# Patient Record
Sex: Male | Born: 1935 | Race: White | Hispanic: No | Marital: Married | Smoking: Current every day smoker
Health system: Southern US, Community
[De-identification: ages and names within clinical notes are randomized; demographics above are authoritative.]

---

## 2012-10-21 DIAGNOSIS — M159 Polyosteoarthritis, unspecified: Secondary | ICD-10-CM | POA: Insufficient documentation

## 2012-12-09 DIAGNOSIS — I2510 Atherosclerotic heart disease of native coronary artery without angina pectoris: Secondary | ICD-10-CM | POA: Insufficient documentation

## 2013-06-30 DIAGNOSIS — G20 Parkinson's disease: Secondary | ICD-10-CM | POA: Insufficient documentation

## 2013-06-30 DIAGNOSIS — F341 Dysthymic disorder: Secondary | ICD-10-CM | POA: Insufficient documentation

## 2013-06-30 DIAGNOSIS — G20A1 Parkinson's disease without dyskinesia, without mention of fluctuations: Secondary | ICD-10-CM | POA: Insufficient documentation

## 2013-07-22 DIAGNOSIS — M1711 Unilateral primary osteoarthritis, right knee: Secondary | ICD-10-CM | POA: Insufficient documentation

## 2014-05-11 DIAGNOSIS — G4752 REM sleep behavior disorder: Secondary | ICD-10-CM | POA: Insufficient documentation

## 2014-10-06 ENCOUNTER — Ambulatory Visit: Payer: Self-pay | Admitting: Urology

## 2014-10-06 DIAGNOSIS — I10 Essential (primary) hypertension: Secondary | ICD-10-CM

## 2014-10-06 DIAGNOSIS — I2510 Atherosclerotic heart disease of native coronary artery without angina pectoris: Secondary | ICD-10-CM

## 2014-10-07 ENCOUNTER — Ambulatory Visit: Payer: Self-pay | Admitting: Urology

## 2015-12-29 DIAGNOSIS — I5032 Chronic diastolic (congestive) heart failure: Secondary | ICD-10-CM | POA: Insufficient documentation

## 2016-02-10 IMAGING — CR DG ABDOMEN 1V
1 series · 2 of 2 positions shown · non-contrast
Comparison: None.

CLINICAL DATA: Calculus with pain on the right side. Blood in urine

EXAM:
ABDOMEN - 1 VIEW

[Series 1: t abdomen supine · 0.14mm/px · 2 of 2 slices shown]
[im 1/2]
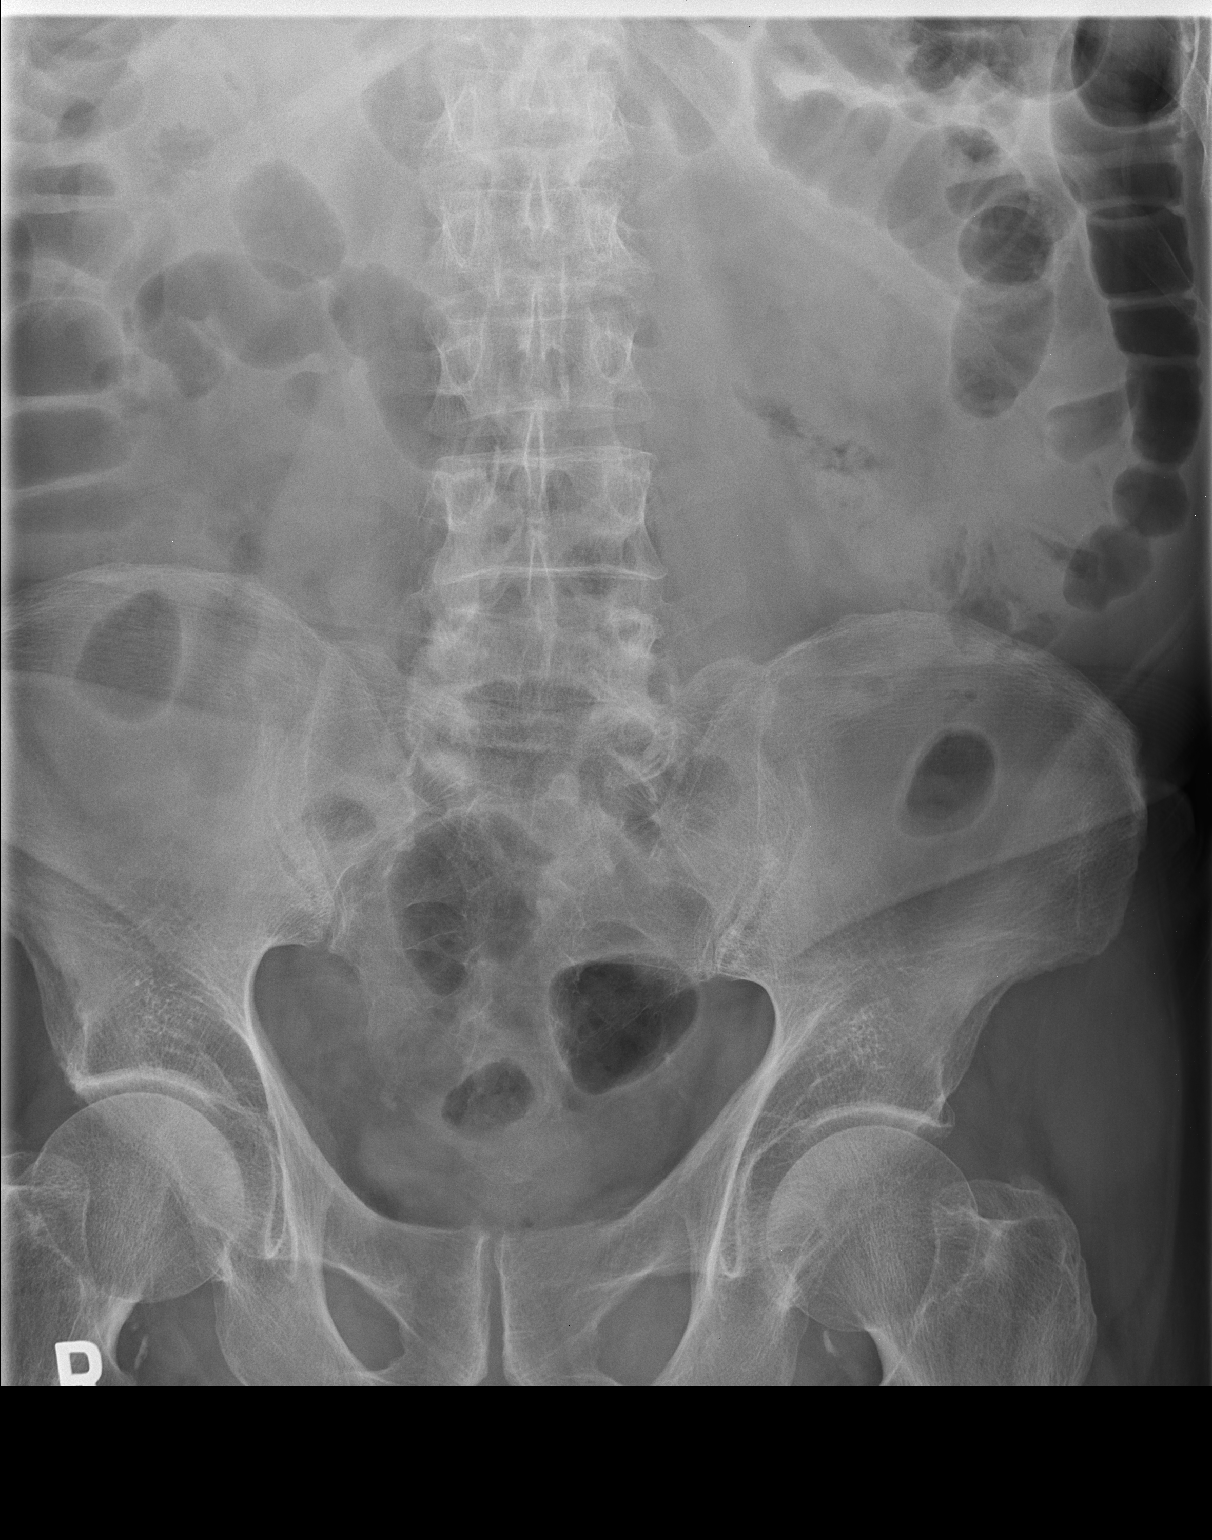
[im 2/2]
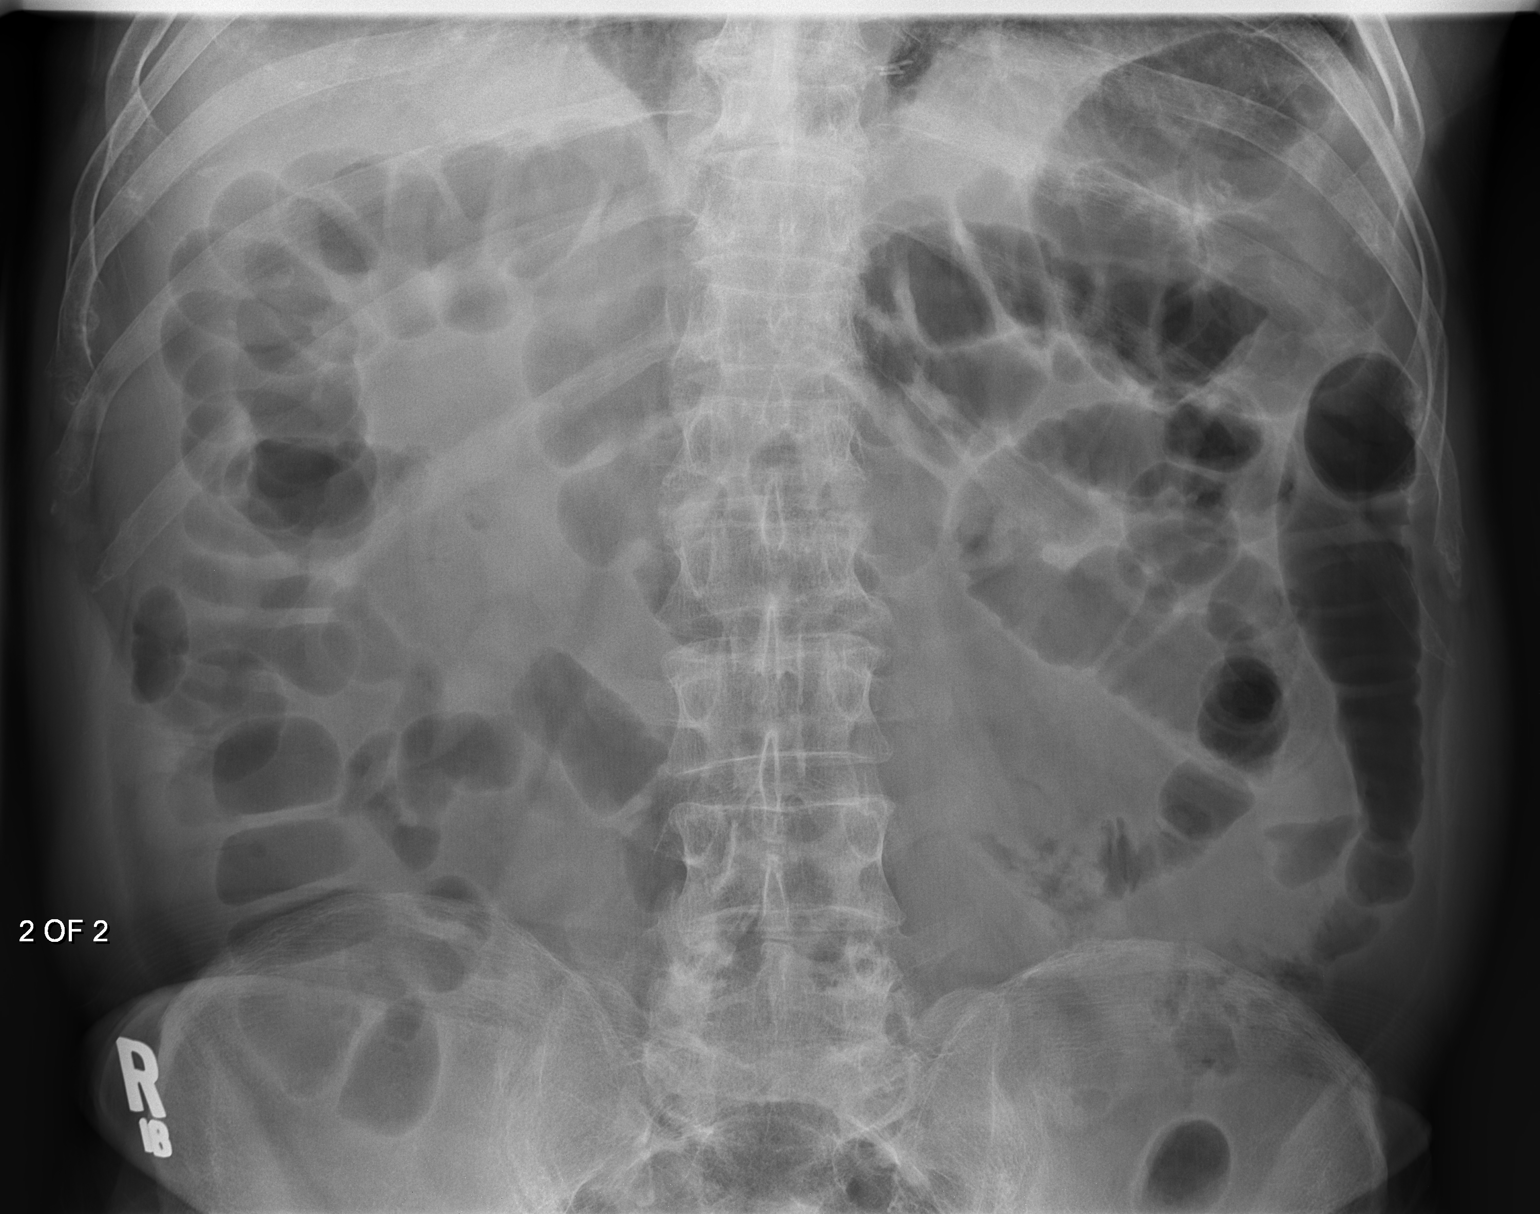

[2 of 2 positions shown; findings below may reference images not displayed]

FINDINGS: There is a 5 mm roughly ovoid calculus in the right hemipelvis.
Shape is not typical for a phlebolith, and given the history this
may represent a stone. No indication of additional urolithiasis, a
left pelvic calcification favors atherosclerosis.

No other explanation for abdominal pain, including bowel
obstruction. No concerning intra-abdominal mass effect.
IMPRESSION: Possible 5 mm distal right ureteral calculus.

## 2020-09-22 DIAGNOSIS — F325 Major depressive disorder, single episode, in full remission: Secondary | ICD-10-CM | POA: Insufficient documentation

## 2021-03-12 DIAGNOSIS — I350 Nonrheumatic aortic (valve) stenosis: Secondary | ICD-10-CM | POA: Insufficient documentation

## 2021-04-10 ENCOUNTER — Encounter: Payer: Self-pay | Admitting: Primary Care

## 2021-04-10 NOTE — Patient Instructions (Signed)
Patient requested ACP document done at same time as his wife's palliative visit. Prepared MOST at his request, left in home and updated VYNCA.

## 2021-04-24 DIAGNOSIS — Z66 Do not resuscitate: Secondary | ICD-10-CM | POA: Insufficient documentation

## 2021-04-24 DIAGNOSIS — R4189 Other symptoms and signs involving cognitive functions and awareness: Secondary | ICD-10-CM | POA: Insufficient documentation

## 2022-03-29 ENCOUNTER — Telehealth: Payer: Self-pay | Admitting: Primary Care

## 2022-03-29 NOTE — Telephone Encounter (Signed)
Attempted to contact patient's nephew Garnette Scheuermann, to offer to schedule a Palliative Consult, no answer - left message with reason for call along with my name and call back number.

## 2022-04-11 ENCOUNTER — Other Ambulatory Visit: Payer: Self-pay | Admitting: Primary Care

## 2022-04-11 ENCOUNTER — Encounter: Payer: Self-pay | Admitting: Primary Care

## 2022-04-11 DIAGNOSIS — G20A1 Parkinson's disease without dyskinesia, without mention of fluctuations: Secondary | ICD-10-CM

## 2022-04-11 DIAGNOSIS — I5032 Chronic diastolic (congestive) heart failure: Secondary | ICD-10-CM

## 2022-04-11 DIAGNOSIS — G20 Parkinson's disease: Secondary | ICD-10-CM

## 2022-04-11 DIAGNOSIS — R634 Abnormal weight loss: Secondary | ICD-10-CM

## 2022-04-11 DIAGNOSIS — G4700 Insomnia, unspecified: Secondary | ICD-10-CM | POA: Insufficient documentation

## 2022-04-11 DIAGNOSIS — Z515 Encounter for palliative care: Secondary | ICD-10-CM

## 2022-04-11 NOTE — Progress Notes (Signed)
Council Consult Note Telephone: [phone]  Fax: [phone]   Date of encounter: 04/11/22 6:16 PM PATIENT NAME: Terry Austin [place] [address]   [phone] (home)  DOB: 05-15-1936 MRN: [id number] PRIMARY CARE PROVIDER:    Wardell Honour, MD [place] [address] [phone]   REFERRING PROVIDER:   Wardell Honour, [place] [address] [phone]  RESPONSIBLE PARTY:    Contact Information     Name Relation Home Work Mobile   [place]   [phone]   [place], Hoog [phone]          I met face to face with patient and family in  home Palliative Care was asked to follow this patient by consultation request of  Wardell Honour, MD  to address advance care planning and complex medical decision making. This is the initial visit.                                     ASSESSMENT AND PLAN / RECOMMENDATIONS:   Advance Care Planning/Goals of Care: Goals include to maximize quality of life and symptom management. Patient/health care surrogate gave his/her permission to discuss.Our advance care planning conversation included a discussion about:    The value and importance of advance care planning  Experiences with loved ones who have been seriously ill or have died  Exploration of personal, cultural or spiritual beliefs that might influence medical decisions  Exploration of goals of care in the event of a sudden injury or illness  Identification of a healthcare agent - nephew Abundio Miu Review and updating or creation of an  advance directive document . CODE STATUS: DNR  I completed a MOST form today. The patient and family outlined their wishes for the following treatment decisions:  Cardiopulmonary Resuscitation: Do Not Attempt Resuscitation (DNR/No CPR)  Medical Interventions: Limited Additional Interventions: Use  medical treatment, IV fluids and cardiac monitoring as indicated, DO NOT USE intubation or mechanical ventilation. May consider use of less invasive airway support such as BiPAP or CPAP. Also provide comfort measures. Transfer to the hospital if indicated. Avoid intensive care.   Antibiotics: Antibiotics if indicated  IV Fluids: IV fluids if indicated  Feeding Tube: No feeding tube  I spent 20 minutes providing this consultation. More than 50% of the time in this consultation was spent in counseling and care coordination. -------------------------------------------------------------------------------------------------------------------------------  Symptom Management/Plan:    I met with patient in his home with his wife, Caregiver and POA Abundio Miu. This is my initial visit, although I have visited the home  to care for his wife for the last year. Patient today appears more mobile and relaxed than previous visits. I do attribute this to full-time in-home caregivers who are who is able to maintain the environment and cook meals.   Patient greeted me at the door with "we are having hamburger steak for supper."  Previously his concerns had been their lack of hot meals and need to drive some 30 minutes one-way to restaurants. I think we will see an improvement in his weight loss now that there is a consistent care giver in home.  I completed his advance care planning documents as they were left at the hospital on his last hospital stay. He now has a live in  caregiver who can assist with  driving to appointments or other outings.  POA feels that things are much better now with the caregivers employed and they have been in place about two weeks.    Wt Readings from Last 10 Encounters (From [facility] Chart): 03/13/22 67.9 kg (149 lb 11.1 oz)  11/06/21 69.1 kg (152 lb 5.4 oz)  07/10/21 74.2 kg (163 lb 9.3 oz)  05/31/21 72.6 kg (160 lb)  04/20/21 72.3 kg (159 lb 6.3 oz)  04/20/21 72.3 kg (159 lb 6.3 oz)   09/22/20 79.1 kg (174 lb 6.1 oz)  05/25/20 78.2 kg (172 lb 4.8 oz)  05/02/20 78.4 kg (172 lb 13.5 oz)  12/07/19 83 kg (182 lb 15.7 oz  Follow up Palliative Care Visit: Palliative care will continue to follow for complex medical decision making, advance care planning, and clarification of goals. Return 8 weeks or prn.  This visit was coded based on medical decision making (MDM).  PPS: 50%  HOSPICE ELIGIBILITY/DIAGNOSIS: TBD  Chief Complaint: debility, PD, weight loss  HISTORY OF PRESENT ILLNESS:  Terry Austin is a 86 y.o. year old male  with PD, weight loss, dementia . Patient seen today to review palliative care needs to include medical decision making and advance care planning as appropriate.   History obtained from review of EMR, discussion with primary team, and interview with family, facility staff/caregiver and/or Mr. Aida Puffer.  I reviewed available labs, medications, imaging, studies and related documents from the EMR.  Records reviewed and summarized above.   ROS  General: NAD ENMT: denies dysphagia Pulmonary: denies cough, denies increased SOB Abdomen: endorses good appetite, denies constipation, endorses continence of bowel GU: denies dysuria, endorses continence of urine MSK:  denies increased weakness,  no falls reported Skin: denies rashes or wounds Neurological: denies pain, endorses occ insomnia Psych: Endorses positive mood Heme/lymph/immuno: denies bruises, abnormal bleeding  Physical Exam: Current and past weights see chart above, 30 lbs loss in 2 years. Constitutional: NAD General: frail appearing, thin EYES: anicteric sclera, lids intact, no discharge  ENMT: intact hearing, oral mucous membranes moist, dentition intact CV: no LE edema Pulmonary: no increased work of breathing, no cough, room air Abdomen: intake 100%, soft and non tender, no ascites GU: deferred MSK: + sarcopenia, moves all extremities, ambulatory Skin: warm and dry, no rashes or wounds  on visible skin Neuro:  + generalized weakness,  + cognitive impairment Psych: non-anxious affect, A and O x 3 Hem/lymph/immuno: no widespread bruising CURRENT PROBLEM LIST:  Patient Active Problem List   Diagnosis Date Noted   Insomnia 04/11/2022   Cognitive impairment 04/24/2021   DNR (do not resuscitate) 04/24/2021   Moderate aortic stenosis 03/12/2021   Major depressive disorder with single episode, in full remission ([facility]) 30/86/5784   Diastolic CHF, chronic ([facility]) 12/29/2015   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   Arthritis of knee, right 07/22/2013   Dysthymia 06/30/2013   Parkinson's disease ([facility]) 06/30/2013   Coronary atherosclerosis 12/09/2012   Generalized osteoarthritis of multiple sites 10/21/2012   PAST MEDICAL HISTORY:  Active Ambulatory Problems    Diagnosis Date Noted   Arthritis of knee, right 07/22/2013   Cognitive impairment 04/24/2021   Coronary atherosclerosis 69/62/9528   Diastolic CHF, chronic ([facility]) 12/29/2015   Generalized osteoarthritis of multiple sites 10/21/2012   DNR (do not resuscitate) 04/24/2021   Dysthymia 06/30/2013   Major depressive disorder with single episode, in full remission ([facility]) 09/22/2020   Insomnia 04/11/2022   Moderate aortic stenosis 03/12/2021   Parkinson's disease ([facility]) 06/30/2013  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   Resolved Ambulatory Problems    Diagnosis Date Noted   No Resolved Ambulatory Problems   No Additional Past Medical History   SOCIAL HX:  Social History   Socioeconomic History   Marital status: Married    Spouse name: Not on file   Number of children: Not on file   Years of education: Not on file   Highest education level: Not on file  Occupational History   Not on file  Tobacco Use   Smoking status: Every Day    Types: Cigarettes   Smokeless tobacco: Not on file  Substance and Sexual Activity   Alcohol use: Not Currently   Drug use: Not on file   Sexual activity: Not on file  Other  Topics Concern   Not on file  Social History Narrative   Not on file   Social Determinants of Health   Financial Resource Strain: Not on file  Food Insecurity: Not on file  Transportation Needs: Not on file  Physical Activity: Not on file  Stress: Not on file  Social Connections: Not on file  Intimate Partner Violence: Not on file     FAMILY HX: No family history on file.    ALLERGIES:  Allergies  Allergen Reactions   Ace Inhibitors Cough and Other (See Comments)   Atenolol Other (See Comments)    Fatigue and depression Fatigue and depression    Bupropion Other (See Comments)    Dysphoria, Gait disturbance Dysphoria, Gait disturbance      PERTINENT MEDICATIONS:  No outpatient encounter medications on file as of 04/11/2022.   No facility-administered encounter medications on file as of 04/11/2022.   Thank you for the opportunity to participate in the care of Mr. Aida Puffer.  The palliative care team will continue to follow. Please call our office at [phone] if we can be of additional assistance.   Jason Coop, NP , DNP, AGPCNP-BC  COVID-19 PATIENT SCREENING TOOL Asked and negative response unless otherwise noted:  Have you had symptoms of covid, tested positive or been in contact with someone with symptoms/positive test in the past 5-10 days?

## 2022-06-20 ENCOUNTER — Other Ambulatory Visit: Payer: Self-pay | Admitting: Primary Care

## 2022-06-20 DIAGNOSIS — I5032 Chronic diastolic (congestive) heart failure: Secondary | ICD-10-CM

## 2022-06-20 DIAGNOSIS — Z515 Encounter for palliative care: Secondary | ICD-10-CM

## 2022-06-20 DIAGNOSIS — G20B1 Parkinson's disease with dyskinesia, without mention of fluctuations: Secondary | ICD-10-CM

## 2022-06-20 DIAGNOSIS — R4189 Other symptoms and signs involving cognitive functions and awareness: Secondary | ICD-10-CM

## 2022-06-20 NOTE — Progress Notes (Signed)
[facility] Palliative [facility] Consult Note Telephone: [phone]  Fax: [phone]    Date of encounter: 06/20/22 PATIENT NAME: Terry Austin [address] [place] [address]   [phone] (home)  DOB: 09/26/1935 MRN: [id number] PRIMARY CARE PROVIDER:    Smith, Kristi M, MD,  [address] [place] [address] [phone]  REFERRING PROVIDER:   Smith, Kristi M, MD [address] [place],  [place] [address] [phone]  RESPONSIBLE PARTY:    Contact Information     Name Relation Home Work Mobile   Terry Austin Nephew   [phone]   Terry Austin Spouse [phone]         I met face to face with patient in the  home. Palliative Care was asked to follow this patient by consultation request of  Smith, Kristi M, MD to address advance care planning.                                   ASSESSMENT AND PLAN / RECOMMENDATIONS:   Advance Care Planning/Goals of Care: Goals include to maximize quality of life and symptom management. Patient/health care surrogate gave his/her permission to discuss.Our advance care planning conversation included a discussion about:     Exploration of personal, cultural or spiritual beliefs that might influence medical decisions  Exploration of goals of care in the event of a sudden injury or illness  Identification of a healthcare agent - nephew  CODE STATUS: DNR I met with patient in his home. Caregiver and wife had gone out to the beauty parlor. His power of attorney is unavailable until later in the day and I will contact him by email. Patient appears well nourished, well developed no weight is available. He is able to converse with me. Has notable oromandibular tardive dyskinesia, but is able to ambulate without a device. He continues to use tobacco products. He endorses that the in-home care as been helpful, although expensive. We discussed the necessity of having care as one ages. He states  he imagines that this is just part of it. I will inform family of the end of our home visiting nurse practitioner program. Terry Austin may still be followed by our community team due to her recent decline. Should Terry Austin need further services, please reach out to our offices.  Had fall and rib fx in late August. Denies discomfort now. Dx Parkinson Disease, Fall risk  Follow up Palliative Care Visit: Informed POA of end of NP home visiting program.  I spent 20 minutes providing this consultation. More than 50% of the time in this consultation was spent in counseling and care coordination.  Thank you for the opportunity to participate in the care of Terry Austin.  The palliative care team will continue to follow. Please call our office at [phone] if we can be of additional assistance.   Kathryn McKelvey Smith, NP ,   COVID-19 PATIENT SCREENING TOOL Asked and negative response unless otherwise noted:   Have you had symptoms of covid, tested positive or been in contact with someone with symptoms/positive test in the past 5-10 days?   

## 2023-12-19 DEATH — deceased
